# Patient Record
Sex: Female | Born: 1990
Health system: Southern US, Community
[De-identification: ages and names within clinical notes are randomized; demographics above are authoritative.]

## PROBLEM LIST (undated history)

## (undated) DIAGNOSIS — R7611 Nonspecific reaction to tuberculin skin test without active tuberculosis: Secondary | ICD-10-CM

## (undated) HISTORY — DX: Nonspecific reaction to tuberculin skin test without active tuberculosis: R76.11

---

## 2012-10-12 DIAGNOSIS — R7611 Nonspecific reaction to tuberculin skin test without active tuberculosis: Secondary | ICD-10-CM

## 2012-10-12 HISTORY — DX: Nonspecific reaction to tuberculin skin test without active tuberculosis: R76.11

## 2015-04-02 LAB — HM PAP SMEAR: HM Pap smear: NEGATIVE

## 2015-10-09 DIAGNOSIS — R102 Pelvic and perineal pain: Secondary | ICD-10-CM | POA: Diagnosis not present

## 2015-10-09 DIAGNOSIS — Z3040 Encounter for surveillance of contraceptives, unspecified: Secondary | ICD-10-CM | POA: Diagnosis not present

## 2015-10-09 DIAGNOSIS — Z01419 Encounter for gynecological examination (general) (routine) without abnormal findings: Secondary | ICD-10-CM | POA: Diagnosis not present

## 2015-10-09 DIAGNOSIS — B373 Candidiasis of vulva and vagina: Secondary | ICD-10-CM | POA: Diagnosis not present

## 2015-10-09 DIAGNOSIS — N898 Other specified noninflammatory disorders of vagina: Secondary | ICD-10-CM | POA: Diagnosis not present

## 2015-10-09 MED FILL — NUVARING VAGINAL RING: 0.12-0.015 | 84 days supply | Qty: 3 | Fill #0

## 2015-10-09 MED FILL — FLUCONAZOLE 150 MG TABLET: 150 | 1 days supply | Qty: 1 | Fill #0

## 2015-10-27 DIAGNOSIS — N941 Unspecified dyspareunia: Secondary | ICD-10-CM | POA: Diagnosis not present

## 2015-10-27 DIAGNOSIS — R102 Pelvic and perineal pain: Secondary | ICD-10-CM | POA: Diagnosis not present

## 2016-01-09 MED FILL — NUVARING VAGINAL RING: 0.12-0.015 | 84 days supply | Qty: 3 | Fill #1

## 2016-03-26 MED FILL — NUVARING VAGINAL RING: 0.12-0.015 | 84 days supply | Qty: 3 | Fill #2

## 2016-05-12 MED FILL — PREVIDENT 5000 BOOSTER PLUS: 1.1 | 30 days supply | Qty: 100 | Fill #0

## 2016-06-22 ENCOUNTER — Ambulatory Visit (INDEPENDENT_AMBULATORY_CARE_PROVIDER_SITE_OTHER): Payer: 59 | Admitting: Family Medicine

## 2016-06-22 ENCOUNTER — Encounter: Payer: Self-pay | Admitting: Family Medicine

## 2016-06-22 VITALS — BP 100/62 | HR 91 | Temp 98.4°F | Ht 65.25 in | Wt 141.3 lb

## 2016-06-22 DIAGNOSIS — Z7689 Persons encountering health services in other specified circumstances: Secondary | ICD-10-CM | POA: Diagnosis not present

## 2016-06-22 DIAGNOSIS — N946 Dysmenorrhea, unspecified: Secondary | ICD-10-CM

## 2016-06-22 LAB — TSH: TSH: 1.21 u[IU]/mL (ref 0.35–4.50)

## 2016-06-22 NOTE — Progress Notes (Signed)
Pre visit review using our clinic review tool, if applicable. No additional management support is needed unless otherwise documented below in the visit note. 

## 2016-06-22 NOTE — Patient Instructions (Signed)
BEFORE YOU LEAVE: -follow up: yearly and as needed  Try naproxen or ibuprofen 1-2 times daily for 2-3 days prior to periods and for first few days.  Please bring records of vaccines, titers, last pap smear and TB treatment for us to scan into medical records.   We recommend the following healthy lifestyle for LIFE:  1) Eat a healthy clean diet.  * Tip: Avoid (less then 1 serving per week): processed foods, sweets, sweetened drinks, white starches (rice, flour, bread, potatoes, pasta, etc), red meat, fast foods, butter  *Tip: CHOOSE instead   * 5-9 servings per day of fresh or frozen fruits and vegetables (but not corn, potatoes, bananas, canned or dried fruit)   *nuts and seeds, beans   *olives and olive oil   *small portions of lean meats such as fish and white chicken    *small portions of whole grains  2)Get at least 150 minutes of sweaty aerobic exercise per week.  3)Reduce stress - consider counseling, meditation and relaxation to balance other aspects of your life.

## 2016-06-22 NOTE — Progress Notes (Signed)
HPI:  Shannon Raymond is here to establish care.  Last PCP and physical:[facility] - she plans to continue with gyn for pap smears. She has history of dysmenorrhea with cramps only with periods for the last few months and saw gyn with neg exam and US. On nuvaring. Regular periods and not heavy bleeding. 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   ROS negative for unless reported above: fevers, unintentional weight loss, hearing or vision loss, chest pain, palpitations, struggling to breath, hemoptysis, melena, hematochezia, hematuria, falls, loc, si, thoughts of self harm  Past Medical History:  Diagnosis Date  . Positive TB test 10/12/2012   treated for latent TB    No past surgical history on file.  Family History  Problem Relation Age of Onset  . Hyperlipidemia Father   . Heart disease Father     MI in 3750s  . Hypertension Father   . Arthritis Maternal Grandmother   . Cancer Maternal Grandmother     beast  . Hyperlipidemia Maternal Grandmother   . Hypertension Maternal Grandmother   . Aortic dissection Maternal Grandmother   . Alcohol abuse Paternal Grandmother   . Alcohol abuse Paternal Grandfather   . Hypertension Paternal Grandfather   . Thyroid cancer Mother   . Skin cancer Maternal Grandfather     Social History   Social History  . Marital status: Unknown    Spouse name: N/A  . Number of children: N/A  . Years of education: N/A   Social History Main Topics  . Smoking status: Never Smoker  . Smokeless tobacco: None  . Alcohol use Yes     Comment: less then 1 drink per week, rare  . Drug use: Unknown  . Sexual activity: Not Asked   Other Topics Concern  . 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  etonogestrel-ethinyl estradiol (NUVARING) 0.12-0.015 MG/24HR vaginal ring, Place 1 each vaginally every 28 (twenty-eight) days. Insert vaginally and leave in place for 3 consecutive weeks, then remove for 1 week., Disp: , Rfl:   EXAM:  Vitals:   06/22/16 1317  BP: 100/62  Pulse: 91  Temp: 98.4 F (36.9 C)    Body mass index is 23.33 kg/m.  GENERAL: vitals reviewed and listed above, alert, oriented, appears well hydrated and in no acute distress  HEENT: atraumatic, conjunttiva clear, no obvious abnormalities on inspection of external nose and ears  NECK: no obvious masses on inspection  LUNGS: clear to auscultation bilaterally, no wheezes, rales or rhonchi, good air movement  CV: HRRR, no peripheral edema  MS: moves all extremities without noticeable abnormality  PSYCH: pleasant and cooperative, no obvious depression or anxiety  ASSESSMENT AND PLAN:  Discussed the following assessment and plan: More than 50% of over 30 minutes spent in total in caring for this patient was spent face-to-face with the patient, counseling and/or coordinating care.   Encounter to establish care  Menstrual cramps - Plan: TSH  -discussed treatment options for dysmenorrhea. She may try nsaids a few days prior to periods and follow up  with gyn if needed after discussion risks/benefits. -she agrees to bring pertinent records. -she wants to check thyroid screening labs today -We reviewed the PMH, PSH, FH, SH, Meds and Allergies. -We provided refills for any medications we will prescribe as needed. -We addressed current concerns per orders and patient instructions. -We have advised patient to follow up per instructions below.   -Patient advised to return or notify a doctor immediately if symptoms worsen or persist or new concerns arise.  Patient Instructions  BEFORE YOU LEAVE: -follow up:  yearly and as needed  Try naproxen or ibuprofen 1-2 times daily for 2-3 days prior to periods and for first few days.  Please bring records of vaccines, titers, last pap smear and TB treatment for [place] to scan into medical records.   We recommend the following healthy lifestyle for LIFE:  1) Eat a healthy clean diet.  * Tip: Avoid (less then 1 serving per week): processed foods, sweets, sweetened drinks, white starches (rice, flour, bread, potatoes, pasta, etc), red meat, fast foods, butter  *Tip: CHOOSE instead   * 5-9 servings per day of fresh or frozen fruits and vegetables (but not corn, potatoes, bananas, canned or dried fruit)   *nuts and seeds, beans   *olives and olive oil   *small portions of lean meats such as fish and white chicken    *small portions of whole grains  2)Get at least 150 minutes of sweaty aerobic exercise per week.  3)Reduce stress - consider counseling, meditation and relaxation to balance other aspects of your life.      Kriste Basque R.

## 2016-06-29 ENCOUNTER — Encounter: Payer: Self-pay | Admitting: Family Medicine

## 2016-06-29 ENCOUNTER — Ambulatory Visit (INDEPENDENT_AMBULATORY_CARE_PROVIDER_SITE_OTHER): Payer: 59 | Admitting: Family Medicine

## 2016-06-29 VITALS — BP 82/60 | HR 77 | Temp 98.6°F | Ht 65.5 in | Wt 141.2 lb

## 2016-06-29 DIAGNOSIS — Z0000 Encounter for general adult medical examination without abnormal findings: Secondary | ICD-10-CM

## 2016-06-29 DIAGNOSIS — E0789 Other specified disorders of thyroid: Secondary | ICD-10-CM | POA: Diagnosis not present

## 2016-06-29 NOTE — Progress Notes (Signed)
Pre visit review using our clinic review tool, if applicable. No additional management support is needed unless otherwise documented below in the visit note. 

## 2016-06-29 NOTE — Progress Notes (Signed)
HPI:  Here for CPE:  -Concerns and/or follow up today: none, needs to have physical for work. She brought all of her prior vaccines and titers to scan in.  -Diet: variety of foods, balance and well rounded -Exercise: no regular exercise  -Taking folic acid, vitamin D or calcium: no  -Diabetes and Dyslipidemia Screening: n/a  -Hx of HTN: no  -Vaccines: UTD  -pap history: UTD, does with gyn - will be seeing Dr. Tyna JakschMiesinger next week  -wants STI testing (Hep C if born [id number]): no  -FH breast, colon or ovarian ca: see FH Last mammogram: n/a Last colon cancer screening: n/a  Breast Ca Risk Assessment: -[employer]eshttp://www.cancer.gov/bcrisktool  -Alcohol, Tobacco, drug use: see social history  Review of Systems - no fevers, unintentional weight loss, vision loss, hearing loss, chest pain, sob, hemoptysis, melena, hematochezia, hematuria, genital discharge, changing or concerning skin lesions, bleeding, bruising, loc, thoughts of self harm or SI  Past Medical History:  Diagnosis Date  . Positive TB test 10/12/2012   treated for latent TB    No past surgical history on file.  Family History  Problem Relation Age of Onset  . Hyperlipidemia Father   . Heart disease Father     MI in 1450s  . Hypertension Father   . Arthritis Maternal Grandmother   . Cancer Maternal Grandmother     beast  . Hyperlipidemia Maternal Grandmother   . Hypertension Maternal Grandmother   . Aortic dissection Maternal Grandmother   . Alcohol abuse Paternal Grandmother   . Alcohol abuse Paternal Grandfather   . Hypertension Paternal Grandfather   . Thyroid cancer Mother   . Skin cancer Maternal Grandfather     Social History   Social History  . Marital status: Single    Spouse name: N/A  . Number of children: N/A  . Years of education: N/A   Social History Main Topics  . Smoking status: Never Smoker  . Smokeless tobacco: None  . Alcohol use Yes     Comment: less then 1 drink per week, rare  .  Drug use: Unknown  . Sexual activity: Not Asked   Other Topics Concern  . 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  etonogestrel-ethinyl estradiol (NUVARING) 0.12-0.015 MG/24HR vaginal ring, Place 1 each vaginally every 28 (twenty-eight) days. Insert vaginally and leave in place for 3 consecutive weeks, then remove for 1 week., Disp: , Rfl:   EXAM:  Vitals:   06/29/16 0738  BP: (!) 82/60  Pulse: 77  Temp: 98.6 F (37 C)    GENERAL: vitals reviewed and listed below, alert, oriented, appears well hydrated and in no acute distress  HEENT: head atraumatic, PERRLA, normal appearance of eyes, ears, nose and mouth. moist mucus membranes.  NECK: supple, oddly - her thyroid seem homogenously full today L > R, non - tender and no discrete nodules appreciated  LUNGS: clear to auscultation bilaterally, no rales, rhonchi or wheeze  CV: HRRR, no peripheral edema or cyanosis, normal pedal pulses  BREAST: declined, does with gyn  ABDOMEN: bowel sounds normal, soft, non tender to palpation, no masses, no rebound  or guarding  GU: declined, she does with gyn  SKIN: no rash or abnormal lesions, declined, does with derm  MS: normal gait, moves all extremities normally  NEURO: normal gait, speech and thought processing grossly intact, muscle tone grossly intact throughout  PSYCH: normal affect, pleasant and cooperative  ASSESSMENT AND PLAN:  Discussed the following assessment and plan:  Encounter for preventive health examination  Thyroid fullness - Plan: US Soft Tissue Head/Neck  -it is odd that her thyroid seems full on exam today as we did see her recently and I did not  notice this. She feels that it seems fuller then usual as well. Her TSH level was normal recently. We will get an [place] to further evaluate.  -Discussed and advised all [place] preventive services health task force level A and B recommendations for age, sex and risks.  -Advised at least 150 minutes of exercise per week and a healthy diet.  -labs, studies and vaccines per orders this encounter  Orders Placed This Encounter  Procedures  . US Soft Tissue Head/Neck    Standing Status:   Future    Standing Expiration Date:   08/29/2017    Order Specific Question:   Reason for Exam (SYMPTOM  OR DIAGNOSIS REQUIRED)    Answer:   thyroid fullness on exam, FH thyroid ca    Order Specific Question:   Preferred imaging location?    Answer:   [id number]    Patient advised to return to clinic immediately if symptoms worsen or persist or new concerns.  There are no Patient Instructions on file for this visit.  No Follow-up on file.  Kriste Basque R., DO

## 2016-06-29 NOTE — Patient Instructions (Signed)
BEFORE YOU LEAVE: -follow up: yearly  We have ordered a thyroid ultrasound at this visit. It can take up to 1-2 weeks for results and processing. IF results require follow up or explanation, we will call you with instructions. Clinically stable results will be released to your [facility]. If you have not heard from [place] or cannot find your results in [facility] in 2 weeks please contact our office at [phone].  If you are not yet signed up for [facility], please consider signing up.

## 2016-07-07 ENCOUNTER — Other Ambulatory Visit: Payer: 59

## 2016-07-07 DIAGNOSIS — Z1389 Encounter for screening for other disorder: Secondary | ICD-10-CM | POA: Diagnosis not present

## 2016-07-07 DIAGNOSIS — Z130 Encounter for screening for diseases of the blood and blood-forming organs and certain disorders involving the immune mechanism: Secondary | ICD-10-CM | POA: Diagnosis not present

## 2016-07-07 DIAGNOSIS — Z01419 Encounter for gynecological examination (general) (routine) without abnormal findings: Secondary | ICD-10-CM | POA: Diagnosis not present

## 2016-07-07 DIAGNOSIS — Z6823 Body mass index (BMI) 23.0-23.9, adult: Secondary | ICD-10-CM | POA: Diagnosis not present

## 2016-07-07 DIAGNOSIS — Z113 Encounter for screening for infections with a predominantly sexual mode of transmission: Secondary | ICD-10-CM | POA: Diagnosis not present

## 2016-07-07 DIAGNOSIS — Z30430 Encounter for insertion of intrauterine contraceptive device: Secondary | ICD-10-CM | POA: Diagnosis not present

## 2016-07-09 ENCOUNTER — Ambulatory Visit
Admission: RE | Admit: 2016-07-09 | Discharge: 2016-07-09 | Disposition: A | Discharge status: Home / Self Care | Payer: 59 | Source: Ambulatory Visit | Attending: Family Medicine | Admitting: Family Medicine

## 2016-07-09 DIAGNOSIS — E0789 Other specified disorders of thyroid: Secondary | ICD-10-CM

## 2016-07-29 ENCOUNTER — Encounter: Payer: Self-pay | Admitting: Family Medicine

## 2016-09-06 DIAGNOSIS — Z30431 Encounter for routine checking of intrauterine contraceptive device: Secondary | ICD-10-CM | POA: Diagnosis not present

## 2016-09-09 ENCOUNTER — Encounter: Payer: Self-pay | Admitting: Family Medicine

## 2016-09-09 ENCOUNTER — Ambulatory Visit (INDEPENDENT_AMBULATORY_CARE_PROVIDER_SITE_OTHER): Payer: 59 | Admitting: Family Medicine

## 2016-09-09 VITALS — BP 90/70 | HR 85 | Temp 97.5°F | Ht 65.5 in | Wt 150.8 lb

## 2016-09-09 DIAGNOSIS — M25561 Pain in right knee: Secondary | ICD-10-CM | POA: Diagnosis not present

## 2016-09-09 NOTE — Progress Notes (Signed)
Pre visit review using our clinic review tool, if applicable. No additional management support is needed unless otherwise documented below in the visit note. 

## 2016-09-09 NOTE — Progress Notes (Signed)
HPI:  R knee pain: -intermittent x several months after starting strenuous hiking -only hurts when takes a 10-15 mile hike -she and boyfriend plan to do 6 month app trail hike, leaving in feb 2018 -pain is mod, R lat knee, occurs after hiking several miles, worse on hills -resolves with rest -not present today -denies: known injury, weakness, numbness, swelling, redness, fever, malaise, clicking, pooping or giving away, catching   ROS: See pertinent positives and negatives per HPI.  Past Medical History:  Diagnosis Date  . Positive TB test 10/12/2012   treated for latent TB    No past surgical history on file.  Family History  Problem Relation Age of Onset  . Hyperlipidemia Father   . Heart disease Father     MI in 7050s  . Hypertension Father   . Arthritis Maternal Grandmother   . Cancer Maternal Grandmother     beast  . Hyperlipidemia Maternal Grandmother   . Hypertension Maternal Grandmother   . Aortic dissection Maternal Grandmother   . Alcohol abuse Paternal Grandmother   . Alcohol abuse Paternal Grandfather   . Hypertension Paternal Grandfather   . Thyroid cancer Mother   . Skin cancer Maternal Grandfather     Social History   Social History  . Marital status: Single    Spouse name: N/A  . Number of children: N/A  . Years of education: N/A   Social History Main Topics  . Smoking status: Never Smoker  . Smokeless tobacco: None  . Alcohol use Yes     Comment: less then 1 drink per week, rare  . Drug use: Unknown  . Sexual activity: Not Asked   Other Topics Concern  . 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  Levonorgestrel (KYLEENA IU), by  Intrauterine route., Disp: , Rfl:  .  Sodium Fluoride (PREVIDENT DT), Place onto teeth., Disp: , Rfl:   EXAM:  Vitals:   09/09/16 1051  BP: 90/70  Pulse: 85  Temp: 97.5 F (36.4 C)    Body mass index is 24.71 kg/m.  GENERAL: vitals reviewed and listed above, alert, oriented, appears well hydrated and in no acute distress  HEENT: atraumatic, conjunttiva clear, no obvious abnormalities on inspection of external nose and ears  NECK: no obvious masses on inspection  MS: moves all extremities without noticeable abnormality Normal gait. Normal inspection of both knees. No effusion or redness. No tenderness to palpation anywhere on exam today. Mild J sign bilaterally with mild medial quad weakness bilat and + single leg squat on the R, she points to her lateral collateral ligament of the area that she thinks the pain is in when it occurs, negative anterior and posterior drawer testing, negative Lachman's, negative valgus and varus stress testing, negative McMurray test, neurovascularly intact distally  PSYCH: pleasant and cooperative, no obvious depression or anxiety  ASSESSMENT AND PLAN:  Discussed the following assessment and plan:  Acute pain of right knee - Plan: DG Knee Complete 4 Views Right  -Suspect patellofemoral syndrome or lateral collateral ligament strain -Given her an ambitious pending plans, opted to get x-rays to exclude  other -Opted to start home exercise program to strengthen the knee and the quadricep -She plans to back down on the hiking for the next 3-4 weeks to allow healing -Follow up in 3-4 weeks, if not improving may have her see sports medicine -Patient advised to return or notify a doctor immediately if symptoms worsen or persist or new concerns arise.  Patient Instructions  BEFORE YOU LEAVE: -home exercises -follow up: 3-4 weeks  Back off on the heavy hiking for 3-4 weeks.  Do the exercises provided at least 4 days per week.  Get the  xray.    Kriste BasqueKIM, Leston Schueller R., DO

## 2016-09-09 NOTE — Patient Instructions (Signed)
BEFORE YOU LEAVE: -home exercises -follow up: 3-4 weeks  Back off on the heavy hiking for 3-4 weeks.  Do the exercises provided at least 4 days per week.  Get the xray.

## 2016-09-22 ENCOUNTER — Ambulatory Visit (INDEPENDENT_AMBULATORY_CARE_PROVIDER_SITE_OTHER)
Admission: RE | Admit: 2016-09-22 | Discharge: 2016-09-22 | Disposition: A | Discharge status: Home / Self Care | Payer: 59 | Source: Ambulatory Visit | Attending: Family Medicine | Admitting: Family Medicine

## 2016-09-22 ENCOUNTER — Encounter: Payer: Self-pay | Admitting: Radiology

## 2016-09-22 DIAGNOSIS — M25561 Pain in right knee: Secondary | ICD-10-CM | POA: Diagnosis not present

## 2016-09-24 ENCOUNTER — Telehealth: Payer: Self-pay | Admitting: Family Medicine

## 2016-09-24 NOTE — Telephone Encounter (Signed)
I called the pt and informed her of the results. 

## 2016-09-24 NOTE — Telephone Encounter (Signed)
I thought I sent a result note on this. Pleas elet her know the xrays looked great per radiology report. Thanks!

## 2016-09-24 NOTE — Telephone Encounter (Signed)
Pt would like a call back about her x-ray  results 

## 2016-10-07 ENCOUNTER — Ambulatory Visit: Payer: 59 | Admitting: Family Medicine

## 2017-03-06 IMAGING — DX DG KNEE COMPLETE 4+V*R*
4 series · 4 of 4 positions shown · non-contrast
Comparison: None.

CLINICAL DATA: Pain in the lateral aspect of the knee when hiking,
no acute injury

EXAM:
RIGHT KNEE - COMPLETE 4+ VIEW

[knee ap]
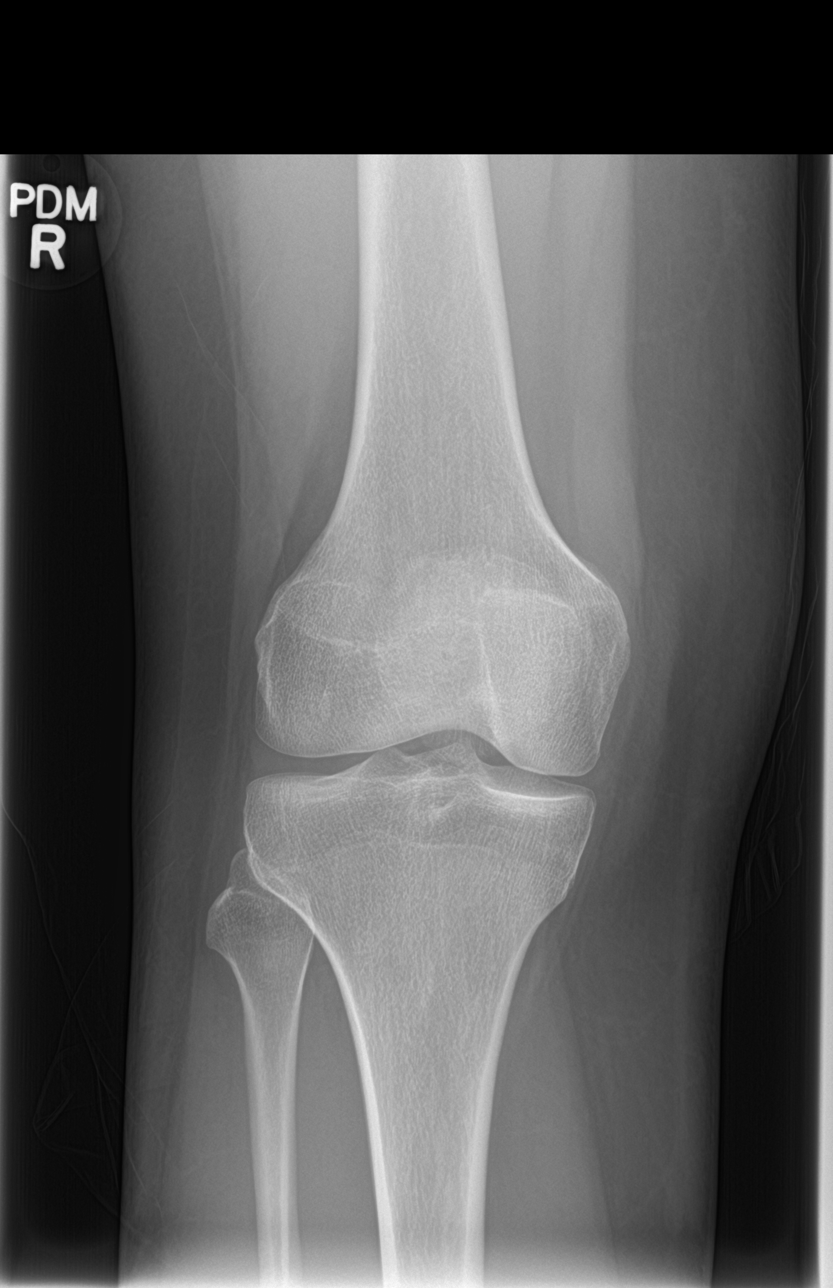

[knee tunnel]
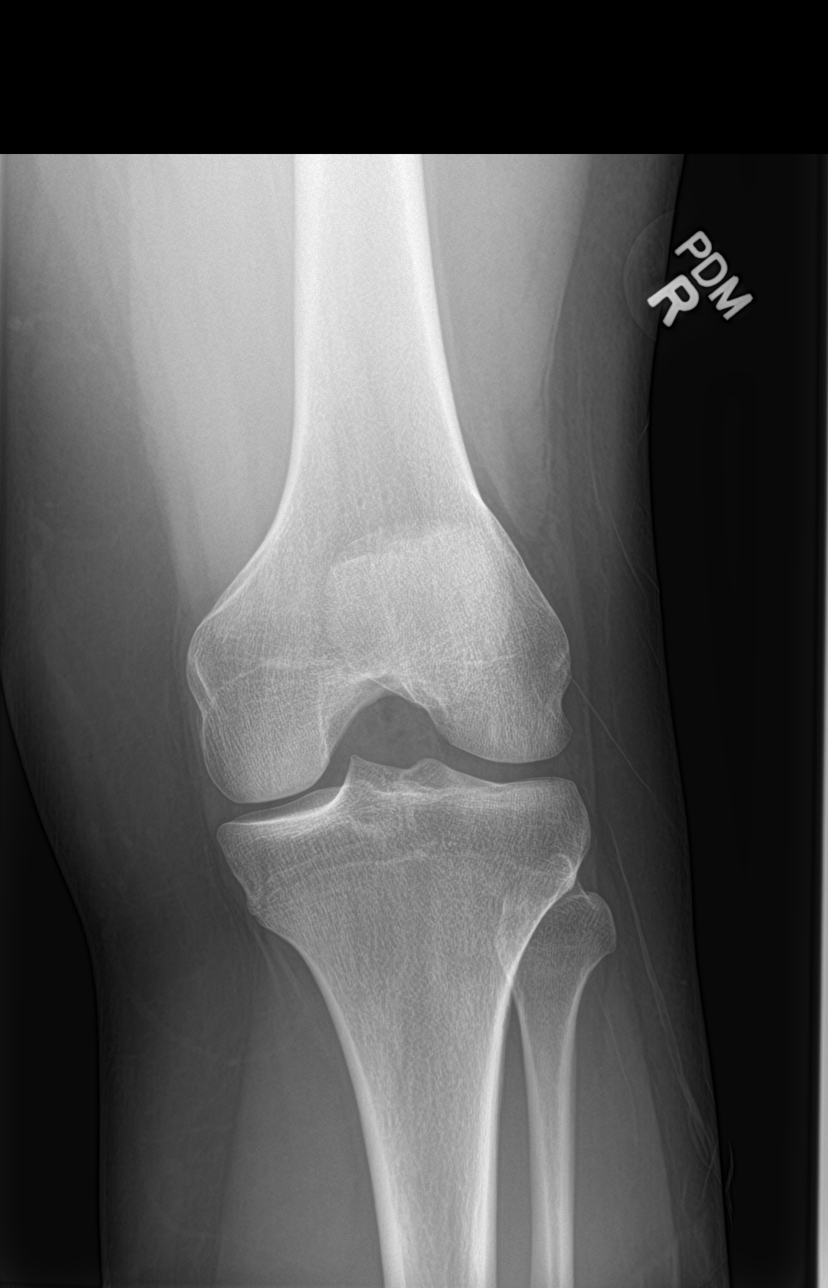

[knee lat]
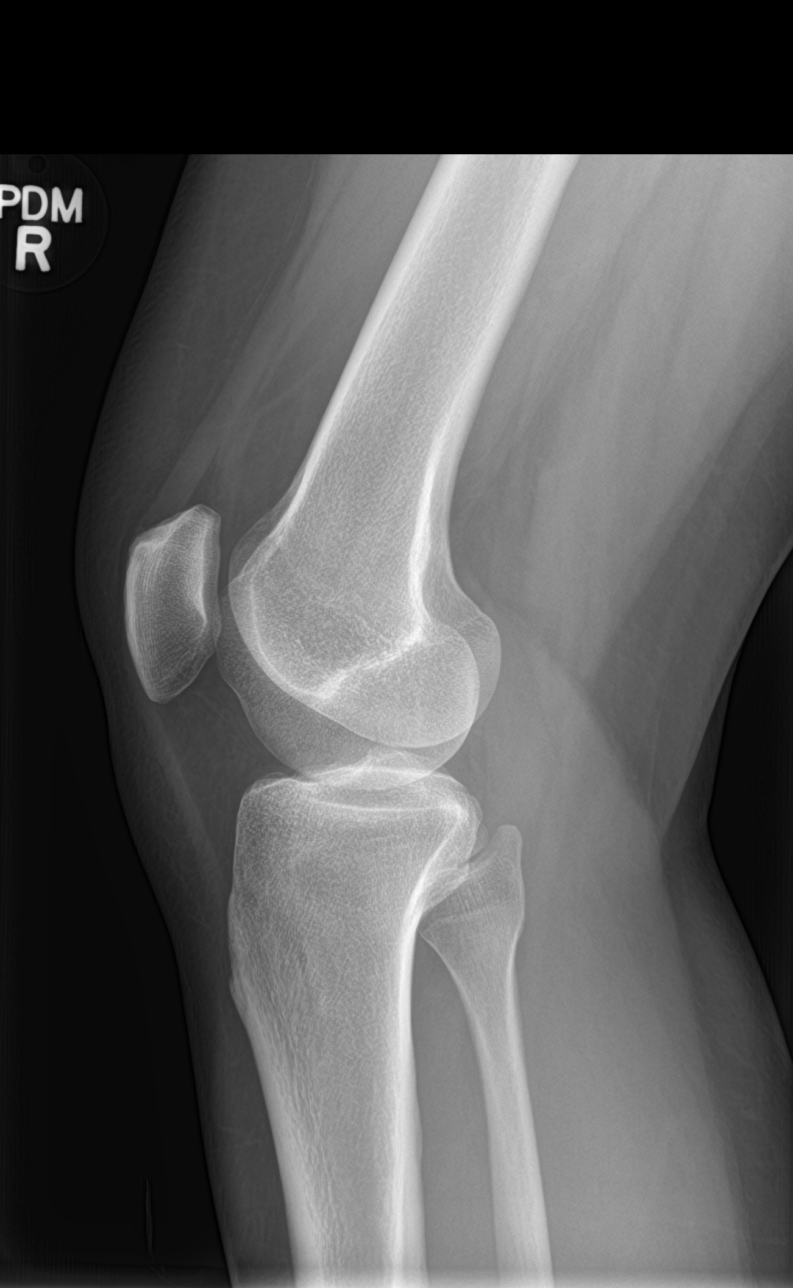

[sunrise]
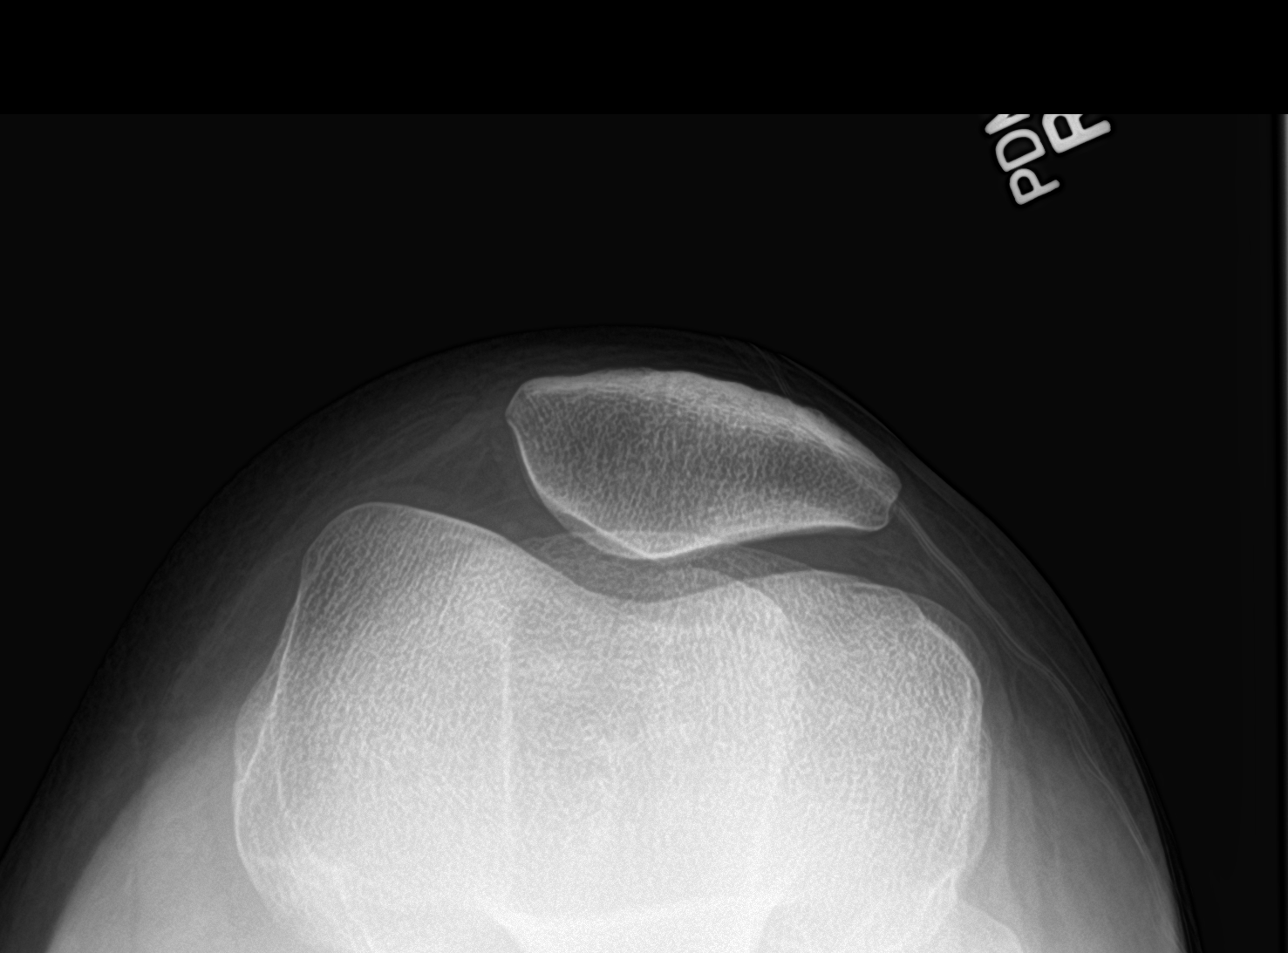

[4 of 4 positions shown; findings below may reference images not displayed]

FINDINGS: The right knee joint spaces appear normal for age. No fracture seen
and there is no evidence of joint space effusion. The patella is
normally positioned.
IMPRESSION: Negative.

## 2017-06-09 ENCOUNTER — Encounter: Payer: Self-pay | Admitting: Family Medicine

## 2017-09-29 ENCOUNTER — Encounter: Payer: Self-pay | Admitting: Family Medicine
